# Patient Record
Sex: Male | Born: 1990 | Race: White | Hispanic: No | Marital: Single
Health system: Southern US, Community
[De-identification: ages and names within clinical notes are randomized; demographics above are authoritative.]

---
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
## 2012-06-13 IMAGING — CR DG ANKLE COMPLETE 3+V*R*
3 series · 3 of 3 positions shown · non-contrast
Comparison: None.

CLINICAL DATA: Right ankle pain secondary to a twisting injury.

RIGHT ANKLE - COMPLETE 3+ VIEW

[view not recorded (1 of 3)]
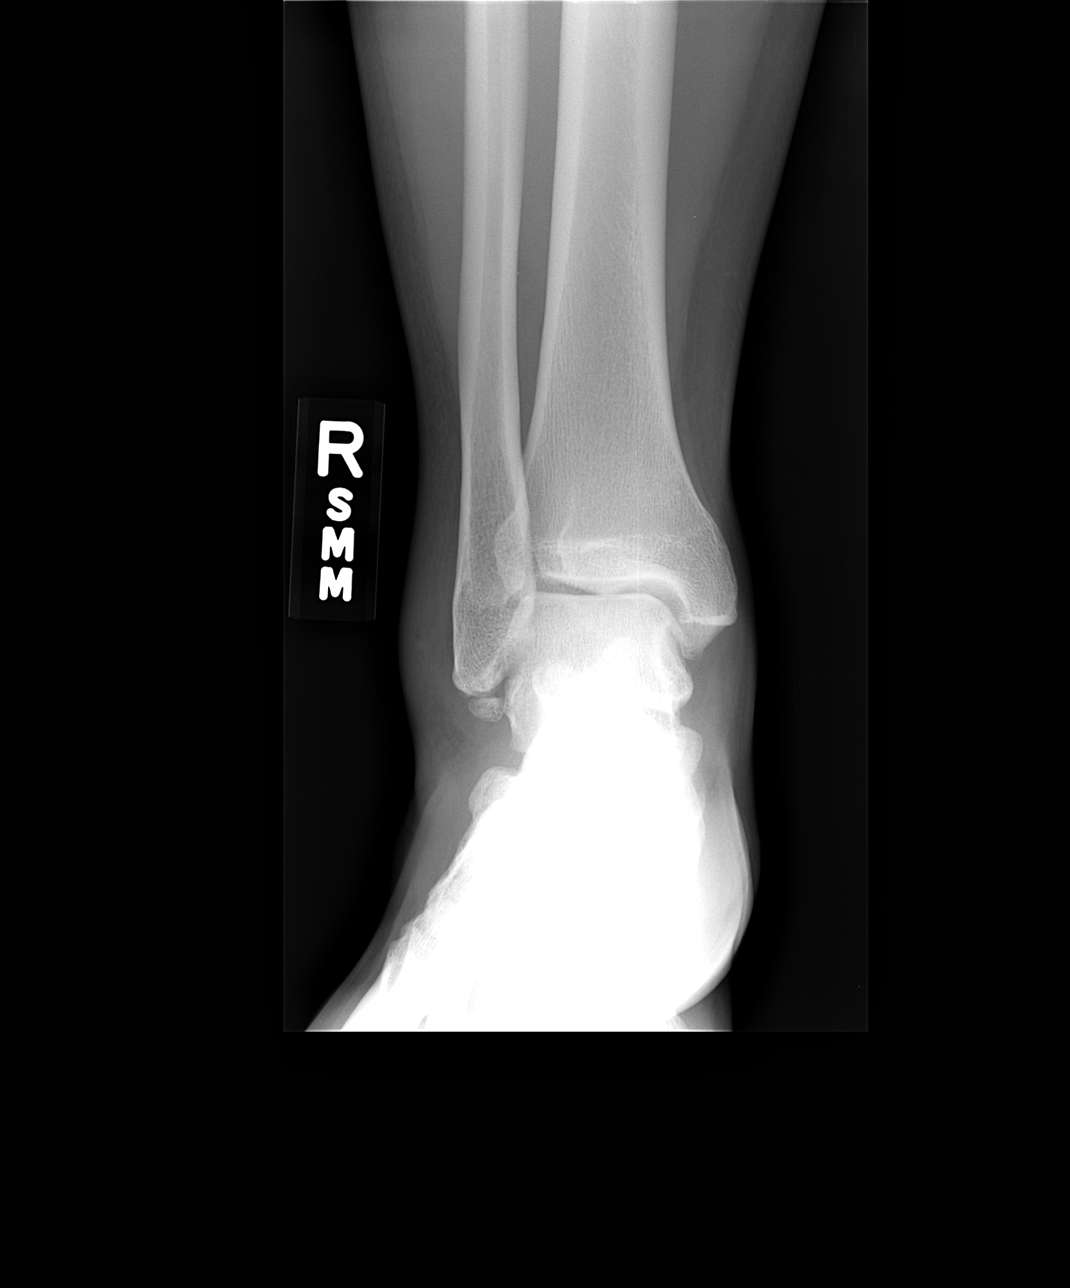

[view not recorded (2 of 3)]
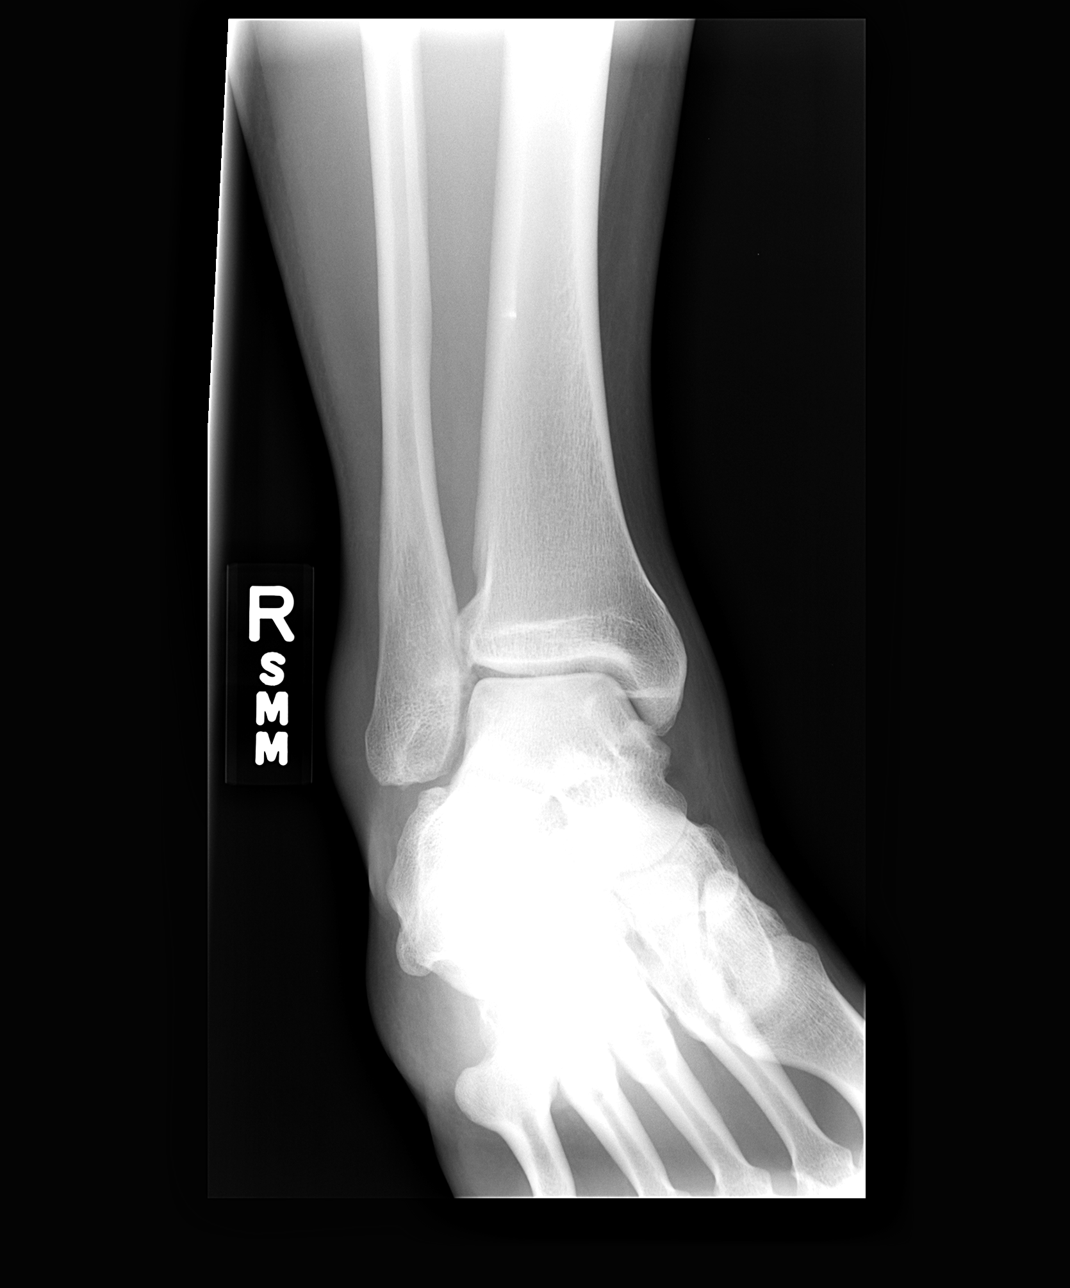

[view not recorded (3 of 3)]
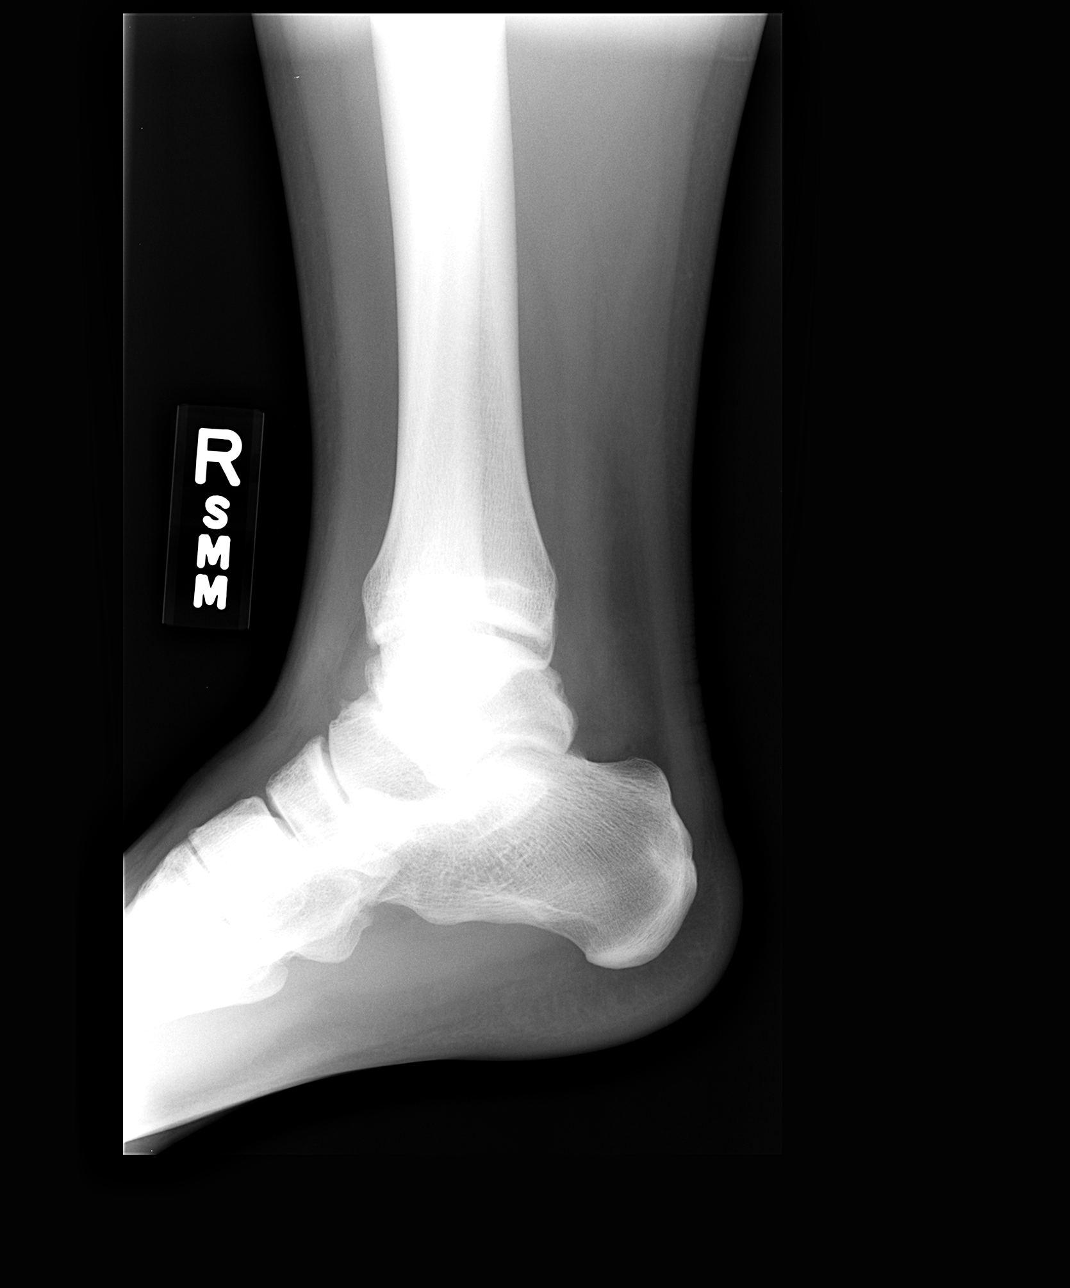

[3 of 3 positions shown; findings below may reference images not displayed]

FINDINGS: There is soft tissue swelling over the lateral malleolus.
There is an old avulsion of the tip of the lateral malleolus.
There is a small ankle effusion with mild degenerative spurs of the
ankle joint.

There is no acute fracture.  No dislocation or subluxation.
IMPRESSION: No acute bony abnormality.  Evidence of prior avulsion from the
distal fibula.  Joint effusion.

## 2019-06-07 ENCOUNTER — Encounter (INDEPENDENT_AMBULATORY_CARE_PROVIDER_SITE_OTHER): Payer: Self-pay | Admitting: Gastroenterology

## 2019-09-20 ENCOUNTER — Ambulatory Visit (INDEPENDENT_AMBULATORY_CARE_PROVIDER_SITE_OTHER): Payer: Self-pay | Admitting: Gastroenterology

## 2020-10-21 ENCOUNTER — Telehealth: Payer: Self-pay

## 2020-10-21 NOTE — Telephone Encounter (Signed)
Received referral on pt from [facility]. DX was not provided, we faxed on 7/6 and 7/19 for the DX. No response. Closing referral as we can not schedule without DX. Faxed [facility] to inform 7/26
